# Patient Record
Sex: Female | Born: 1975 | Race: White | Hispanic: No | Marital: Single | Smoking: Never smoker
Health system: Southern US, Community
[De-identification: ages and names within clinical notes are randomized; demographics above are authoritative.]

## PROBLEM LIST (undated history)

## (undated) DIAGNOSIS — E042 Nontoxic multinodular goiter: Secondary | ICD-10-CM

## (undated) HISTORY — DX: Nontoxic multinodular goiter: E04.2

---

## 2000-03-08 ENCOUNTER — Emergency Department (HOSPITAL_COMMUNITY): Admission: EM | Admit: 2000-03-08 | Discharge: 2000-03-08 | Payer: Self-pay | Admitting: Internal Medicine

## 2002-08-10 ENCOUNTER — Other Ambulatory Visit: Admission: RE | Admit: 2002-08-10 | Discharge: 2002-08-10 | Payer: Self-pay | Admitting: Obstetrics and Gynecology

## 2003-08-31 ENCOUNTER — Other Ambulatory Visit: Admission: RE | Admit: 2003-08-31 | Discharge: 2003-08-31 | Payer: Self-pay | Admitting: Obstetrics and Gynecology

## 2004-08-31 ENCOUNTER — Other Ambulatory Visit: Admission: RE | Admit: 2004-08-31 | Discharge: 2004-08-31 | Payer: Self-pay | Admitting: Obstetrics and Gynecology

## 2005-10-22 ENCOUNTER — Other Ambulatory Visit: Admission: RE | Admit: 2005-10-22 | Discharge: 2005-10-22 | Payer: Self-pay | Admitting: Obstetrics and Gynecology

## 2008-01-01 ENCOUNTER — Emergency Department (HOSPITAL_COMMUNITY): Admission: EM | Admit: 2008-01-01 | Discharge: 2008-01-01 | Payer: Self-pay | Admitting: Family Medicine

## 2012-01-07 ENCOUNTER — Other Ambulatory Visit: Payer: Self-pay | Admitting: Obstetrics & Gynecology

## 2012-01-07 DIAGNOSIS — E041 Nontoxic single thyroid nodule: Secondary | ICD-10-CM

## 2012-01-08 ENCOUNTER — Other Ambulatory Visit: Payer: Self-pay | Admitting: Obstetrics & Gynecology

## 2012-01-08 ENCOUNTER — Ambulatory Visit
Admission: RE | Admit: 2012-01-08 | Discharge: 2012-01-08 | Disposition: A | Discharge status: Home / Self Care | Payer: BC Managed Care – PPO | Source: Ambulatory Visit | Attending: Obstetrics & Gynecology | Admitting: Obstetrics & Gynecology

## 2012-01-08 DIAGNOSIS — E041 Nontoxic single thyroid nodule: Secondary | ICD-10-CM

## 2012-01-10 ENCOUNTER — Other Ambulatory Visit (HOSPITAL_COMMUNITY)
Admission: RE | Admit: 2012-01-10 | Discharge: 2012-01-10 | Disposition: A | Discharge status: Home / Self Care | Payer: BC Managed Care – PPO | Source: Ambulatory Visit | Attending: Interventional Radiology | Admitting: Interventional Radiology

## 2012-01-10 ENCOUNTER — Ambulatory Visit
Admission: RE | Admit: 2012-01-10 | Discharge: 2012-01-10 | Disposition: A | Discharge status: Home / Self Care | Payer: BC Managed Care – PPO | Source: Ambulatory Visit | Attending: Obstetrics & Gynecology | Admitting: Obstetrics & Gynecology

## 2012-01-10 DIAGNOSIS — E049 Nontoxic goiter, unspecified: Secondary | ICD-10-CM | POA: Insufficient documentation

## 2012-01-10 DIAGNOSIS — E041 Nontoxic single thyroid nodule: Secondary | ICD-10-CM

## 2013-09-26 IMAGING — US US SOFT TISSUE HEAD/NECK
1 series · 14 of 25 positions shown · non-contrast
Comparison: None.

CLINICAL DATA: Thyromegaly.

THYROID ULTRASOUND
TECHNIQUE: Ultrasound examination of the thyroid gland and adjacent
soft tissues was performed.

[Series 1: us soft tissue head/neck · 0.08mm/px · 14 of 66 slices shown]
[im 1/66]
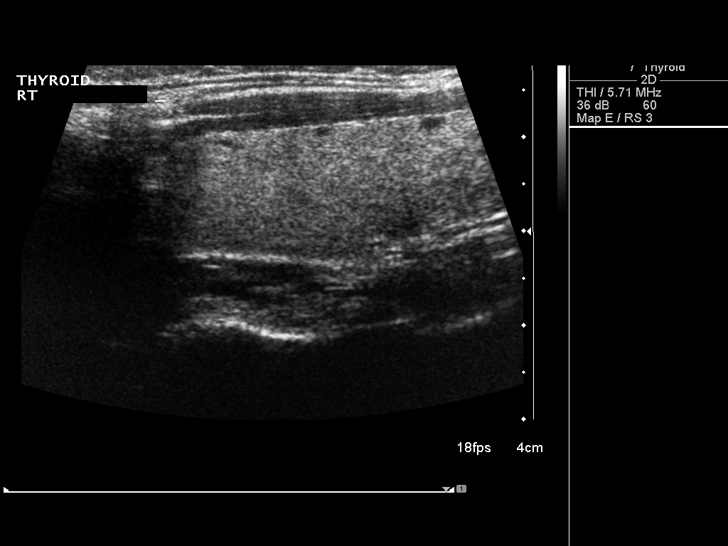
[im 6/66]
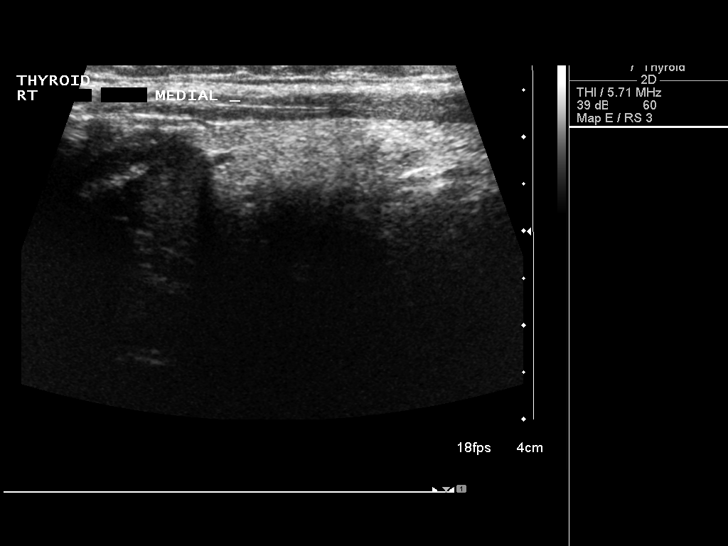
[im 11/66]
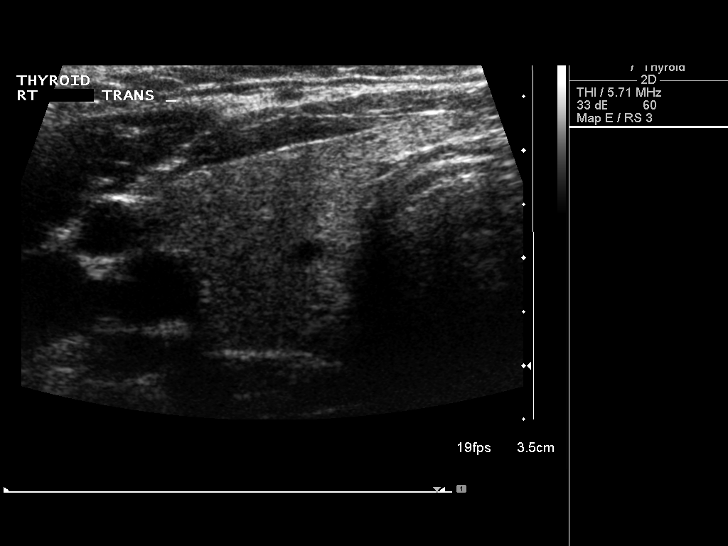
[im 17/66]
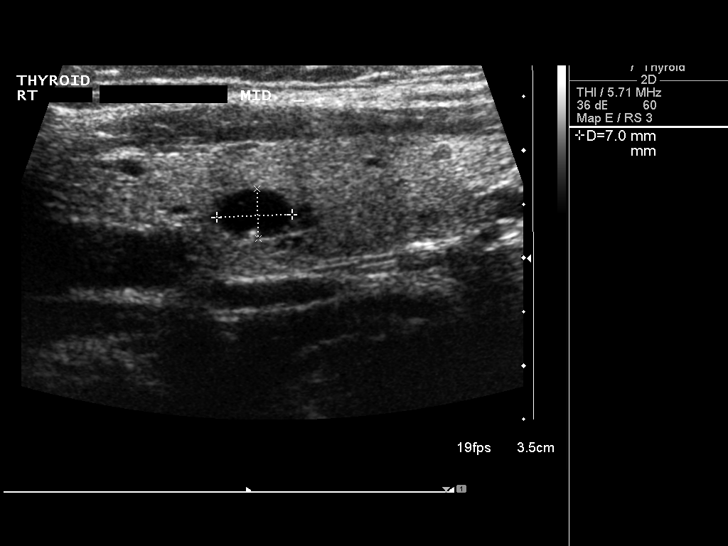
[im 22/66]
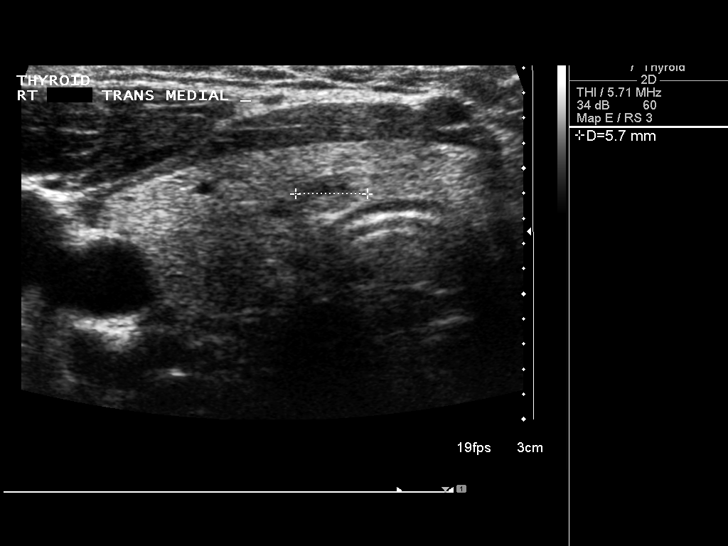
[im 25/66]
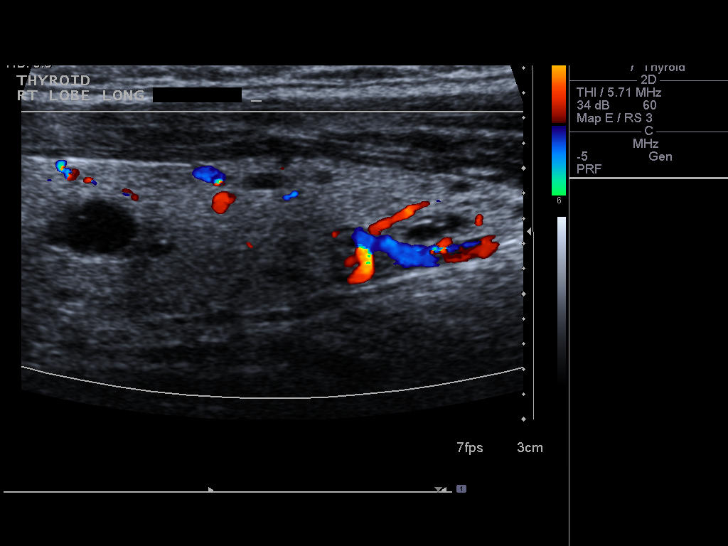
[im 30/66]
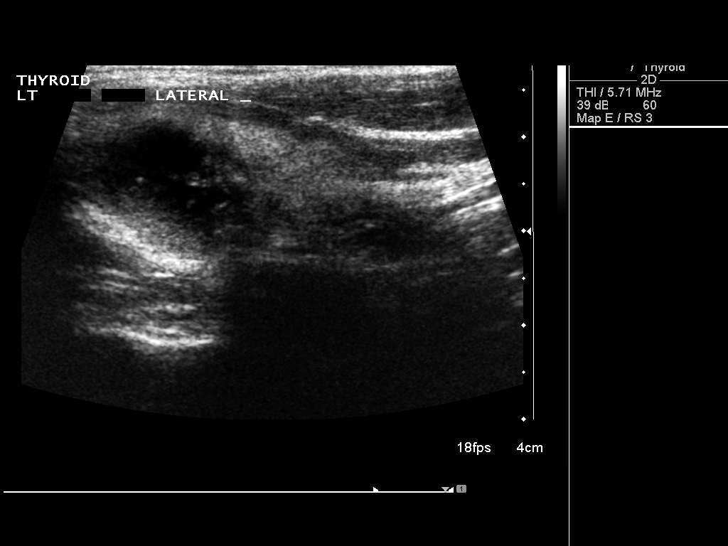
[im 36/66]
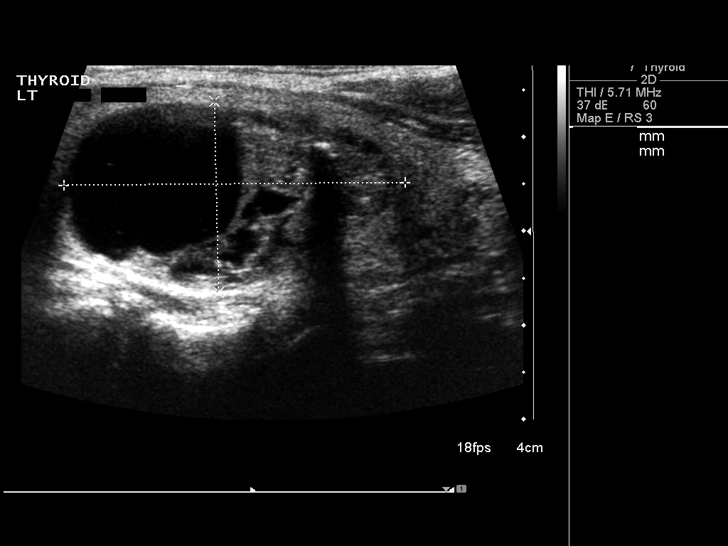
[im 41/66]
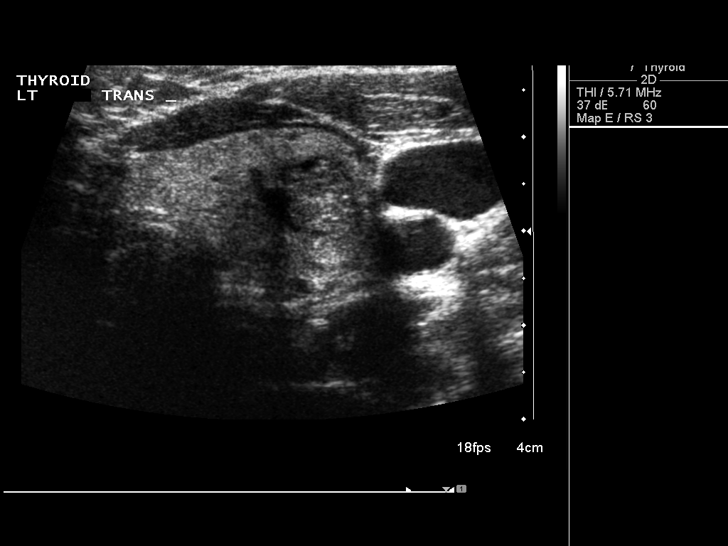
[im 44/66]
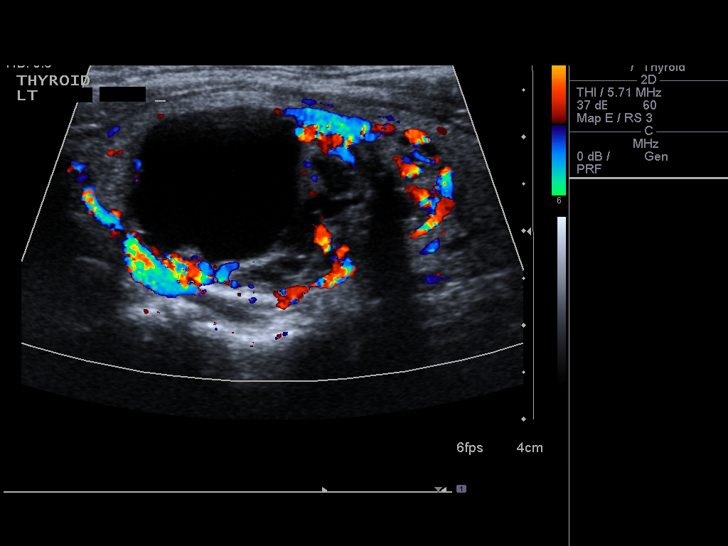
[im 49/66]
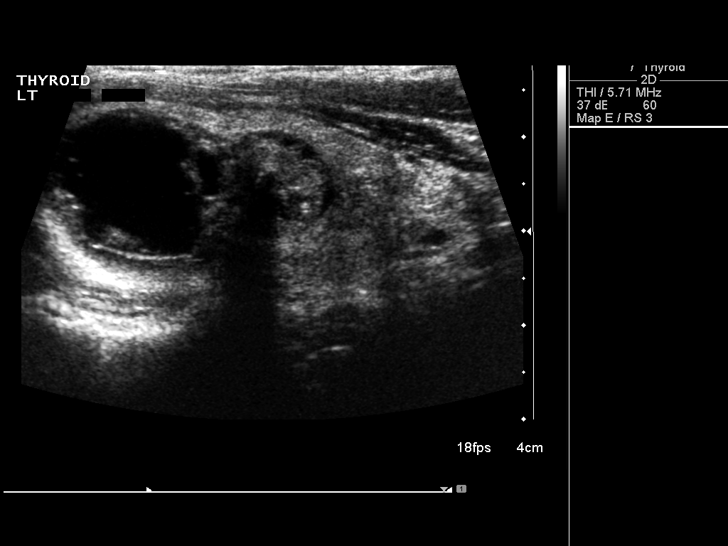
[im 55/66]
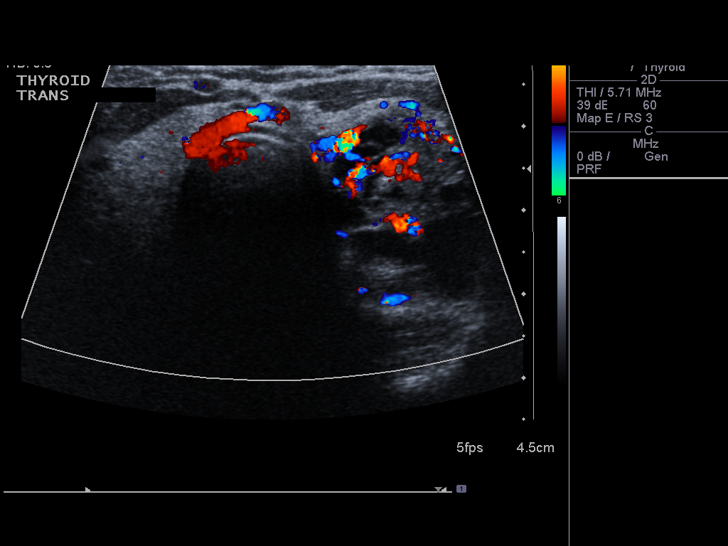
[im 60/66]
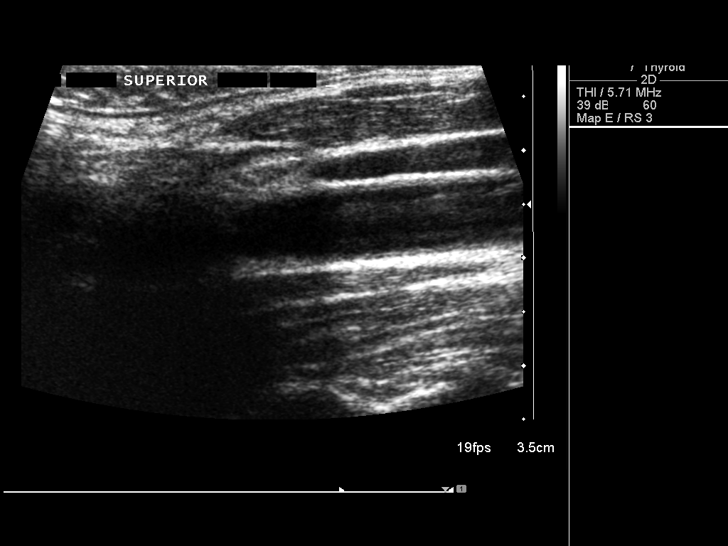
[im 66/66]
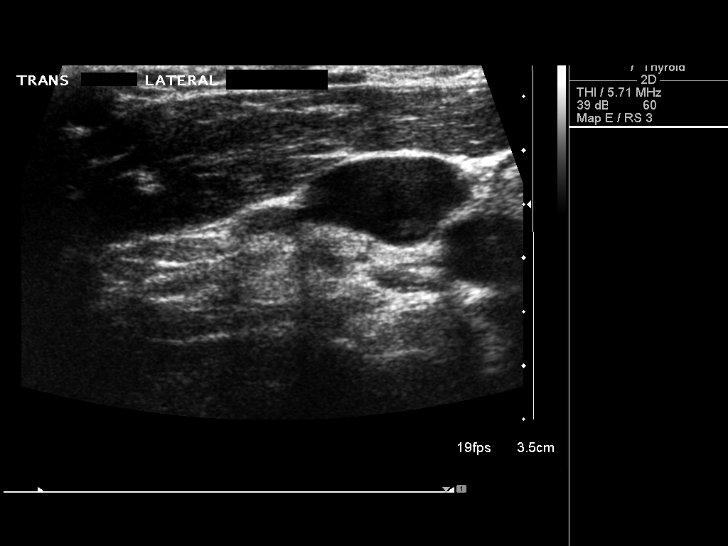

[14 of 25 positions shown; findings below may reference images not displayed]

FINDINGS: Right thyroid lobe:  Measures 5.9 x 1.6 x 2.1 cm.
Left thyroid lobe:  Measures 6.2 x 2.2 x 2.7 cm.
Isthmus:  Measures 0.4 cm.

Homogeneous thyroid echotexture.

Focal nodules:  There are numerous tiny nodules in the right
thyroid lobe.  The largest cystic lesion measures 7 mm and the
largest solid lesion measures 6.6 mm.

In the left lobe there is a large complex thyroid mass with cystic
and solid components and calcification.  It measures a maximum of
3.6 x 2.0 x 2.7 cm.  Recommend fine needle aspiration/biopsy.

Lymphadenopathy:  None visualized.
IMPRESSION: 1.  Large complex dominant left thyroid nodule requires fine-needle
aspiration/biopsy.
2.  Multiple small nodules in the right thyroid lobe.

## 2013-09-28 IMAGING — US US THYROID BIOPSY
1 series · 11 of 11 positions shown · non-contrast
Comparison: Thyroid ultrasound dated 01/08/2012

CLINICAL DATA: Dominant left thyroid nodule.

ULTRASOUND GUIDED NEEDLE ASPIRATE BIOPSY OF THE THYROID GLAND

[Series 1: us thyroid biopsy · 0.08mm/px · 11 acquisitions, 11 frames shown]
[im 1/11]
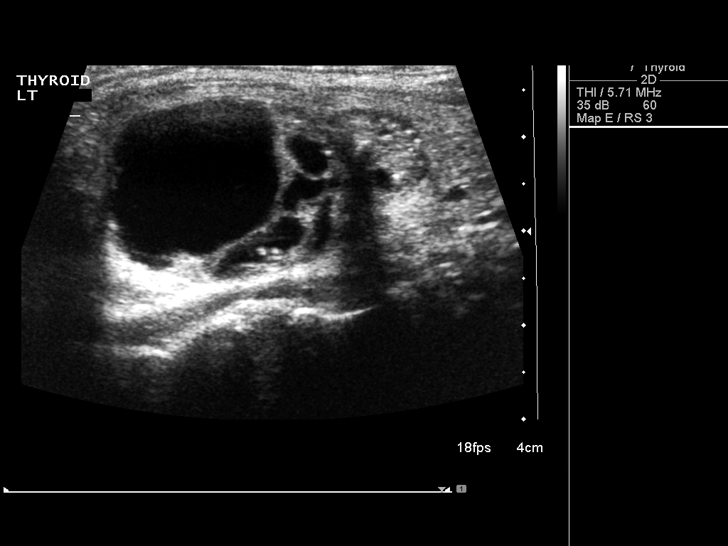
[im 2/11]
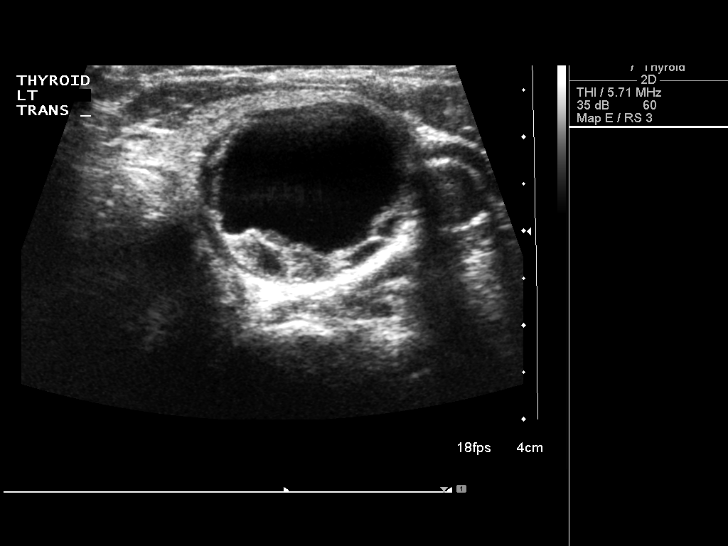
[im 3/11]
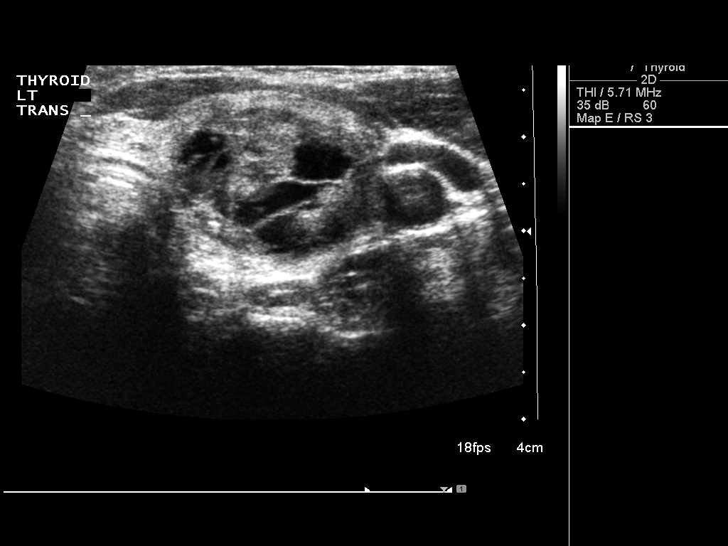
[im 4/11]
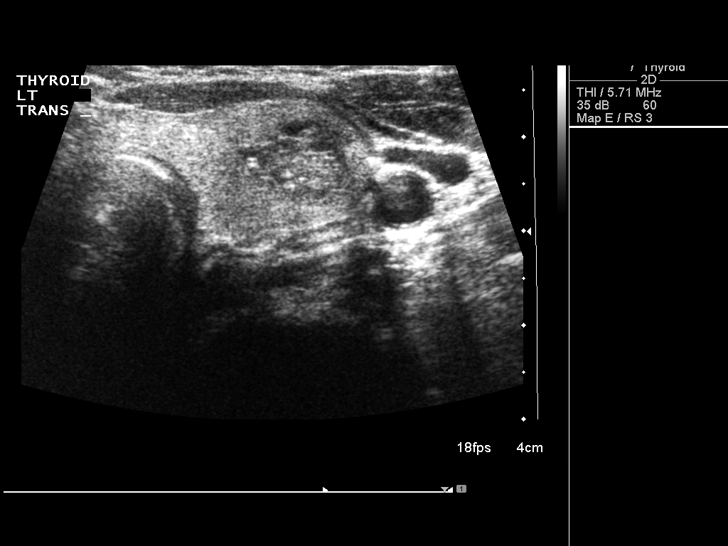
[im 5/11]
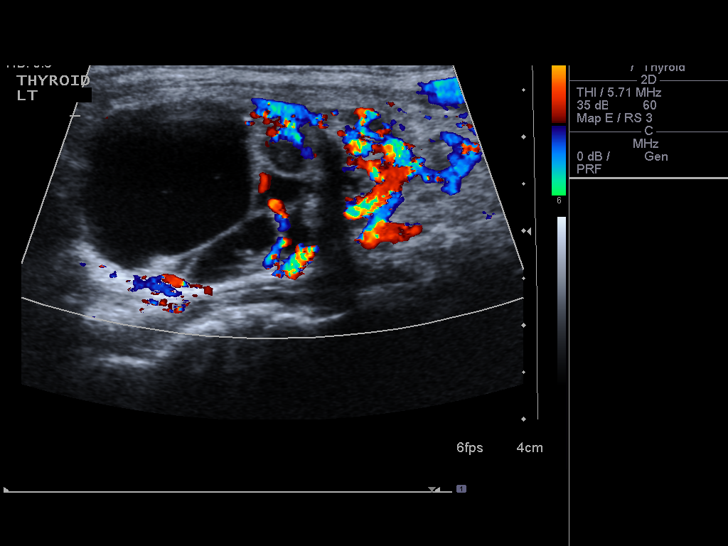
[im 6/11]
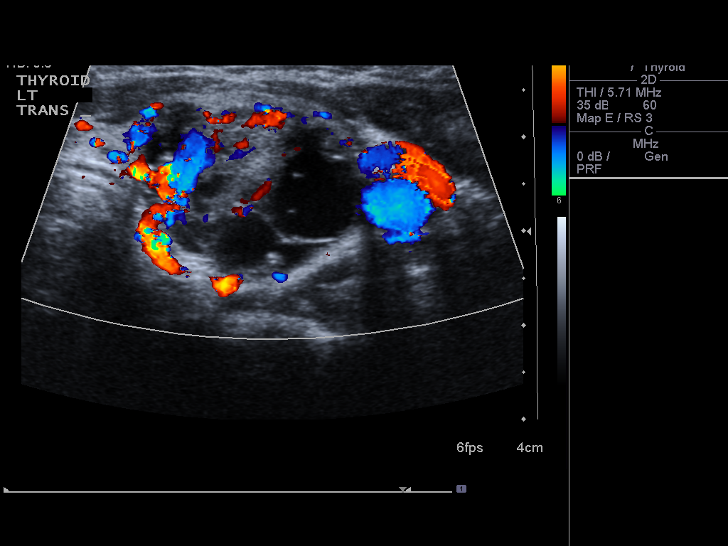
[im 7/11]
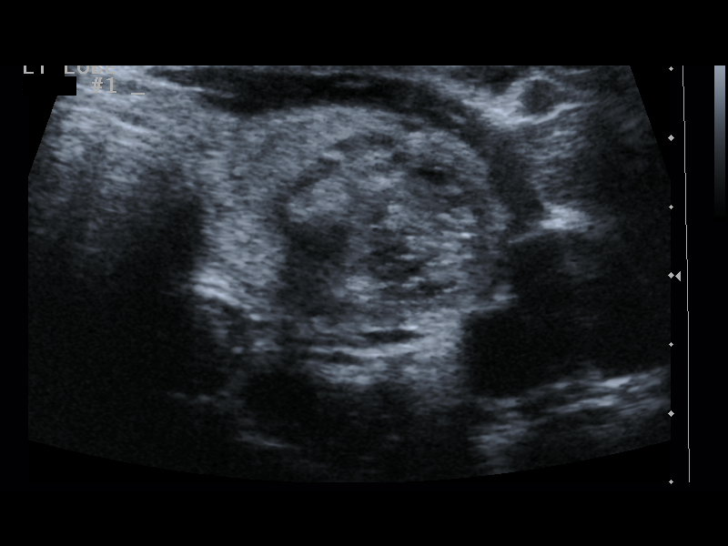
[im 8/11]
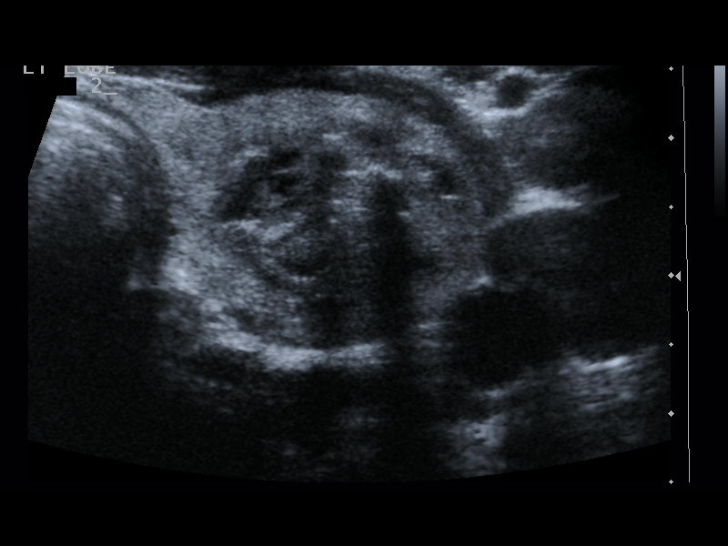
[im 9/11]
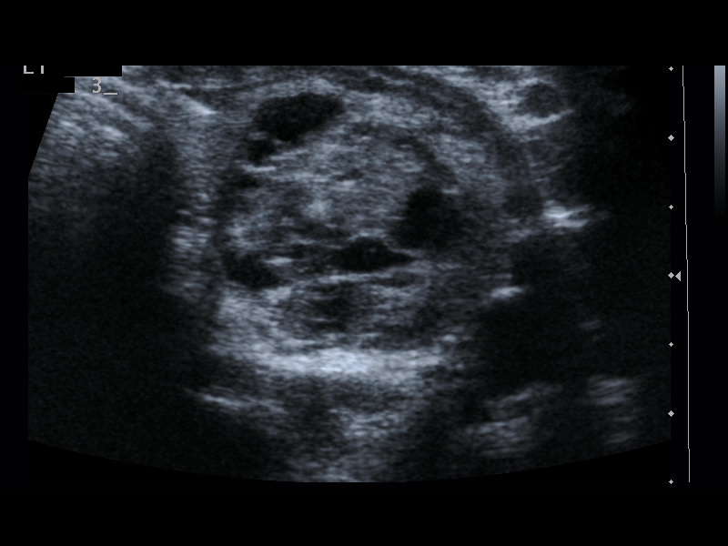
[im 10/11]
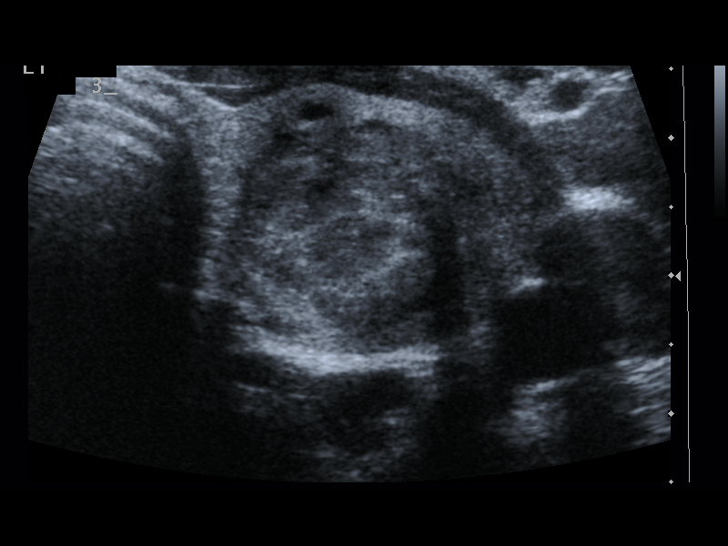
[im 11/11]
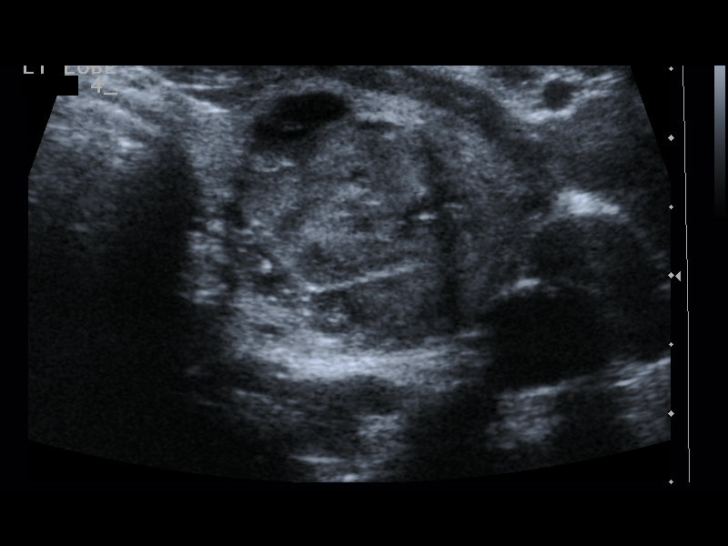

[11 of 11 positions shown; findings below may reference images not displayed]

Thyroid biopsy was thoroughly discussed with the patient and
questions were answered.  The benefits, risks, alternatives, and
complications were also discussed.  The patient understands and
wishes to proceed with the procedure.  Written consent was
obtained.

Ultrasound was performed to localize and mark an adequate site for
the biopsy.  The patient was then prepped and draped in a normal
sterile fashion.  Local anesthesia was provided with 1% lidocaine.
Using direct ultrasound guidance, 4 passes were made using 25 gauge
needles into the nodule within the left lobe of the thyroid.
Ultrasound was used to confirm needle placements on all occasions.
Specimens were sent to Pathology for analysis.

Complications:  None
FINDINGS: Needle aspirate samples were obtained in both solid and
cystic aspects of the dominant left thyroid nodule.
IMPRESSION: Ultrasound guided needle aspirate biopsy performed of the left
thyroid nodule.

## 2014-02-15 ENCOUNTER — Encounter: Payer: Self-pay | Admitting: Podiatry

## 2014-02-15 ENCOUNTER — Ambulatory Visit (INDEPENDENT_AMBULATORY_CARE_PROVIDER_SITE_OTHER): Payer: BC Managed Care – PPO | Admitting: Podiatry

## 2014-02-15 ENCOUNTER — Ambulatory Visit: Payer: Self-pay

## 2014-02-15 VITALS — BP 131/71 | HR 57 | Resp 16 | Ht 67.0 in | Wt 158.0 lb

## 2014-02-15 DIAGNOSIS — M2020 Hallux rigidus, unspecified foot: Secondary | ICD-10-CM

## 2014-02-15 DIAGNOSIS — M948X9 Other specified disorders of cartilage, unspecified sites: Secondary | ICD-10-CM

## 2014-02-15 MED ORDER — TRIAMCINOLONE ACETONIDE 10 MG/ML IJ SUSP
10.0000 mg | Freq: Once | INTRAMUSCULAR | Status: AC
  Administered 2014-02-15: 10 mg

## 2014-02-15 NOTE — Progress Notes (Signed)
   Subjective:    Patient ID: Ashlee HawthorneMichelle L Chumney, female    DOB: 10/25/1976, 38 y.o.   MRN: [id number]  HPI Comments: "I have pain at this joint"  Patient c/o aching 1st MPJ right for a couple months. The area usually does NOT swell or get red. She wears high heels for work and they make extremely uncomfortable. Walking aggravates. She hasn't tried any home treatments.  Foot Pain      Review of Systems  All other systems reviewed and are negative.      Objective:   Physical Exam        Assessment & Plan:

## 2014-02-15 NOTE — Progress Notes (Signed)
Subjective:     Patient ID: Ashlee HawthorneMichelle L Bowman, female   DOB: 06/05/1976, 38 y.o.   MRN: [id number]  Foot Pain   patient states that she's been having pain around her big toe joint for the last few months and has gradually gotten worse. She can walk on it and is wearing a heel shoe today but it is uncomfortable   Review of Systems  All other systems reviewed and are negative.      Objective:   Physical Exam  Nursing note and vitals reviewed. Constitutional: She is oriented to person, place, and time.  Cardiovascular: Intact distal pulses.   Musculoskeletal: Normal range of motion.  Neurological: She is oriented to person, place, and time.  Skin: Skin is warm.   neurovascular status was found to be intact with range of motion subtalar midtarsal joint and muscle strength adequate. First MPJ range of motion is adequate with no crepitus or limitation noted and I noted  discomfort plantar aspect first metatarsal head around the tibial sesamoidal complex. Patient has good Fill time and was found to have normal arch height     Assessment:     Probable sesamoiditis around the first metatarsal with possibility for small fracture of the sesamoid    Plan:     H&P and x-ray reviewed. Did a careful plantar injection 3 mg Kenalog 5 mg Xylocaine Marcaine mixture and applied dancers pads reduce pressure against the joint. Reappoint one week and also discussed with her possibility of fracture the sesamoid that we may need to immobilize and ultimately it is possible the sesamoid we'll have to be removed

## 2014-02-24 ENCOUNTER — Ambulatory Visit (INDEPENDENT_AMBULATORY_CARE_PROVIDER_SITE_OTHER): Payer: BC Managed Care – PPO | Admitting: Podiatry

## 2014-02-24 ENCOUNTER — Encounter: Payer: Self-pay | Admitting: Podiatry

## 2014-02-24 VITALS — BP 138/80 | HR 87 | Resp 16

## 2014-02-24 DIAGNOSIS — M948X9 Other specified disorders of cartilage, unspecified sites: Secondary | ICD-10-CM

## 2014-02-25 NOTE — Progress Notes (Signed)
Subjective:     Patient ID: Ashlee HawthorneMichelle L Ralph, female   DOB: 03/07/1976, 38 y.o.   MRN: [id number]  HPI patient presents stating my big toe joint feels quite a bit better with mild discomfort but no true deep pain noted. I'm still scared to bend it too much   Review of Systems     Objective:   Physical Exam Neurovascular status intact with range of motion first MPJ that's doing very well with slight crepitus and minimal discomfort when moved    Assessment:     Functional hallux limitus condition which responded well to injection treatment    Plan:     Reviewed condition and recommended long-term orthotics with graphite extension which were casted today. She will be returned when custom orthotics are ready in approximately 2 weeks

## 2014-03-19 ENCOUNTER — Other Ambulatory Visit: Payer: BC Managed Care – PPO

## 2014-03-26 ENCOUNTER — Ambulatory Visit (INDEPENDENT_AMBULATORY_CARE_PROVIDER_SITE_OTHER): Payer: BC Managed Care – PPO | Admitting: *Deleted

## 2014-03-26 ENCOUNTER — Other Ambulatory Visit: Payer: BC Managed Care – PPO

## 2014-03-26 DIAGNOSIS — M2020 Hallux rigidus, unspecified foot: Secondary | ICD-10-CM

## 2014-03-26 NOTE — Progress Notes (Signed)
   Subjective:    Patient ID: Ashlee Bowman, female    DOB: 03/18/1976, 38 y.o.   MRN: [id number]  HPI PICK UP ORTHOTICS AND GIVEN INSTRUCTION.   Review of Systems     Objective:   Physical Exam        Assessment & Plan:

## 2014-03-26 NOTE — Patient Instructions (Signed)

## 2014-08-25 ENCOUNTER — Other Ambulatory Visit: Payer: Self-pay

## 2014-08-26 LAB — CYTOLOGY - PAP

## 2014-09-27 ENCOUNTER — Other Ambulatory Visit: Payer: Self-pay

## 2015-11-30 ENCOUNTER — Other Ambulatory Visit: Payer: Self-pay | Admitting: Obstetrics & Gynecology

## 2017-12-12 DIAGNOSIS — Z6823 Body mass index (BMI) 23.0-23.9, adult: Secondary | ICD-10-CM | POA: Diagnosis not present

## 2017-12-12 DIAGNOSIS — Z124 Encounter for screening for malignant neoplasm of cervix: Secondary | ICD-10-CM | POA: Diagnosis not present

## 2017-12-12 DIAGNOSIS — Z113 Encounter for screening for infections with a predominantly sexual mode of transmission: Secondary | ICD-10-CM | POA: Diagnosis not present

## 2017-12-12 DIAGNOSIS — Z01419 Encounter for gynecological examination (general) (routine) without abnormal findings: Secondary | ICD-10-CM | POA: Diagnosis not present

## 2019-09-17 DIAGNOSIS — J029 Acute pharyngitis, unspecified: Secondary | ICD-10-CM | POA: Diagnosis not present

## 2019-09-17 DIAGNOSIS — E041 Nontoxic single thyroid nodule: Secondary | ICD-10-CM | POA: Diagnosis not present

## 2019-09-17 DIAGNOSIS — J069 Acute upper respiratory infection, unspecified: Secondary | ICD-10-CM | POA: Diagnosis not present

## 2022-07-13 ENCOUNTER — Ambulatory Visit: Payer: No Typology Code available for payment source | Admitting: Sports Medicine

## 2022-07-13 ENCOUNTER — Ambulatory Visit (INDEPENDENT_AMBULATORY_CARE_PROVIDER_SITE_OTHER): Payer: No Typology Code available for payment source

## 2022-07-13 VITALS — BP 110/68 | HR 78 | Ht 67.0 in | Wt 176.0 lb

## 2022-07-13 DIAGNOSIS — M5136 Other intervertebral disc degeneration, lumbar region: Secondary | ICD-10-CM | POA: Diagnosis not present

## 2022-07-13 DIAGNOSIS — G8929 Other chronic pain: Secondary | ICD-10-CM | POA: Diagnosis not present

## 2022-07-13 DIAGNOSIS — M5441 Lumbago with sciatica, right side: Secondary | ICD-10-CM | POA: Diagnosis not present

## 2022-07-13 DIAGNOSIS — M545 Low back pain, unspecified: Secondary | ICD-10-CM | POA: Diagnosis not present

## 2022-07-13 MED ORDER — MELOXICAM 15 MG PO TABS: 15.0000 mg | ORAL_TABLET | Freq: Every day | ORAL | 0 refills | Status: DC

## 2022-07-13 NOTE — Patient Instructions (Addendum)
Good to see you  - Start meloxicam 15 mg daily x2 weeks.  If still having pain after 2 weeks, complete 3rd-week of meloxicam. May use remaining meloxicam as needed once daily for pain control.  Do not to use additional NSAIDs while taking meloxicam.  May use Tylenol [id number] mg 2 to 3 times a day for breakthrough pain. HEP  3 week follow up

## 2022-07-13 NOTE — Progress Notes (Signed)
    [facility] [address] [place] [address] Phone: [phone]   Assessment and Plan:     1. Chronic bilateral low back pain with right-sided sciatica 2. DDD (degenerative disc disease), lumbar -Chronic with exacerbation, initial sports medicine visit - Most consistent with sciatica likely of lumbar origin based on HPI, physical exam, x-ray images showing grade 1 anterior listhesis L4 and L5 with decreased disc space - I suspect that at some point patient had a herniated disc at L4-L5 which has resulted in degenerative changes seen on x-ray and is leading to current inflammation and symptoms - X-ray obtained in clinic.  My interpretation: No acute fracture or vertebral collapse.  Grade 1 anterior listhesis L4 and L5 and decreased disc space most prominent between L4-L5 - Start meloxicam 15 mg daily x2 weeks.  If still having pain after 2 weeks, complete 3rd-week of meloxicam. May use remaining meloxicam as needed once daily for pain control.  Do not to use additional NSAIDs while taking meloxicam.  May use Tylenol 680 121 1943 mg 2 to 3 times a day for breakthrough pain. -Start HEP for core and low back strengthening to offload low back  Other orders - meloxicam (MOBIC) 15 MG tablet; Take 1 tablet (15 mg total) by mouth daily.    Pertinent previous records reviewed include none   Follow Up: 3 to 4 weeks for reevaluation.  If no improvement or worsening of symptoms, could consider OMT versus MRI of lumbar spine to eventually consider epidural injections   Subjective:   I, Ashlee Bowman, am serving as a Neurosurgeon for Doctor Richardean Sale  Chief Complaint: back pain   HPI:   07/13/22 Patient is a 46 year old female complaining of back pain. Patient states that she takes pure bare classes when she does a plank she feels a popping sensation in her lower spine that radiates down her leg, she notes she has numbness on her lateral  thigh, sitting causes pain , this has been going on since feb, massaging feels good but the pain doesn't go away, no meds regularly for the pain , family hx of back surgeries , states she has sciatic pain that goes down the leg , no radiating pain , she is unable to do certain movements, no hip pain   Relevant Historical Information: None pertinent  Additional pertinent review of systems negative.   Current Outpatient Medications:    meloxicam (MOBIC) 15 MG tablet, Take 1 tablet (15 mg total) by mouth daily., Disp: 30 tablet, Rfl: 0   Objective:     Vitals:   07/13/22 1019  BP: 110/68  Pulse: 78  SpO2: 99%  Weight: 176 lb (79.8 kg)  Height: 5\' 7"  (1.702 m)      Body mass index is 27.57 kg/m.    Physical Exam:    Gen: Appears well, nad, nontoxic and pleasant Psych: Alert and oriented, appropriate mood and affect Neuro: sensation intact, strength is 5/5 in upper and lower extremities, muscle tone wnl Skin: no susupicious lesions or rashes  Back - Normal skin, Spine with normal alignment and no deformity.   Mild tenderness to L3-L5 vertebral process palpation.   Paraspinous muscles are not tender and without spasm Straight leg raise negative Centralized low back pain with lumbar extension Negative piriformis test Gait normal  Electronically signed by:  D[facility] 10:59 AM 07/13/22

## 2022-08-09 NOTE — Progress Notes (Signed)
    Ashlee Bowman D.[facility] Phone: [phone]   Assessment and Plan:     1. Chronic bilateral low back pain with right-sided sciatica 2. 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 Patient states that she takes pure bare classes when she does a plank she feels a popping sensation in her lower spine that radiates down her leg, she notes she has numbness on her lateral thigh, sitting causes pain , this has been going on since feb, massaging feels good but the pain doesn't go away, no meds regularly for the pain , family hx of back surgeries , states she has sciatic pain that goes down the leg , no radiating pain , she is unable to do certain movements, no hip pain    08/10/2022 Patient states that she has one meloxicam left , does feel better than it did but it is still there, leg pain has somewhat subsided    Relevant Historical Information: None pertinent  Additional pertinent review of systems negative.   Current Outpatient Medications:    meloxicam (MOBIC)  15 MG tablet, Take 1 tablet (15 mg total) by mouth daily., Disp: 30 tablet, Rfl: 0   Objective:     Vitals:   08/10/22 1024  BP: 126/80  Pulse: 61  SpO2: 98%  Weight: 170 lb (77.1 kg)  Height: 5\' 7"  (1.702 m)      Body mass index is 26.63 kg/m.    Physical Exam:      Gen: Appears well, nad, nontoxic and pleasant Psych: Alert and oriented, appropriate mood and affect Neuro: sensation intact, strength is 5/5 in upper and lower extremities, muscle tone wnl Skin: no susupicious lesions or rashes   Back - Normal skin, Spine with normal alignment and no deformity.   Mild tenderness to L3-L5 vertebral process palpation.   Paraspinous muscles are not tender and without spasm Straight leg raise positive on right, negative on the left Centralized low back pain with lumbar extension Negative piriformis test Gait normal     Electronically signed by:  Ashlee Bowman D.[facility] 10:39 AM 08/10/22

## 2022-08-10 ENCOUNTER — Ambulatory Visit: Payer: No Typology Code available for payment source | Admitting: Sports Medicine

## 2022-08-10 VITALS — BP 126/80 | HR 61 | Ht 67.0 in | Wt 170.0 lb

## 2022-08-10 DIAGNOSIS — M5441 Lumbago with sciatica, right side: Secondary | ICD-10-CM

## 2022-08-10 DIAGNOSIS — M5136 Other intervertebral disc degeneration, lumbar region: Secondary | ICD-10-CM

## 2022-08-10 DIAGNOSIS — G8929 Other chronic pain: Secondary | ICD-10-CM | POA: Diagnosis not present

## 2022-08-10 NOTE — Patient Instructions (Addendum)
Good to see you  MRI referral  Follow up 3 days after to discuss your results

## 2022-09-05 ENCOUNTER — Ambulatory Visit
Admission: RE | Admit: 2022-09-05 | Discharge: 2022-09-05 | Disposition: A | Discharge status: Home / Self Care | Payer: No Typology Code available for payment source | Source: Ambulatory Visit | Attending: Sports Medicine | Admitting: Sports Medicine

## 2022-09-05 DIAGNOSIS — G8929 Other chronic pain: Secondary | ICD-10-CM

## 2022-09-05 DIAGNOSIS — M5136 Other intervertebral disc degeneration, lumbar region: Secondary | ICD-10-CM

## 2022-09-17 NOTE — Progress Notes (Unsigned)
    Ashlee Bowman D.[facility] [address] [place] [address] Phone: [phone]   Assessment and Plan:     There are no diagnoses linked to this encounter.  ***   Pertinent previous records reviewed include ***   Follow Up: ***     Subjective:   I, Ashlee Bowman, am serving as a Neurosurgeon for Doctor Ashlee Bowman   Chief Complaint: back pain    HPI:    07/13/22 Patient is a 46 year old female complaining of back pain. Patient states that she takes pure bare classes when she does a plank she feels a popping sensation in her lower spine that radiates down her leg, she notes she has numbness on her lateral thigh, sitting causes pain , this has been going on since feb, massaging feels good but the pain doesn't go away, no meds regularly for the pain , family hx of back surgeries , states she has sciatic pain that goes down the leg , no radiating pain , she is unable to do certain movements, no hip pain    08/10/2022 Patient states that she has one meloxicam left , does feel better than it did but it is still there, leg pain has somewhat subsided    09/18/2022 Patient states    Relevant Historical Information: None pertinent Additional pertinent review of systems negative.   Current Outpatient Medications:    meloxicam (MOBIC) 15 MG tablet, Take 1 tablet (15 mg total) by mouth daily., Disp: 30 tablet, Rfl: 0   Objective:     There were no vitals filed for this visit.    There is no height or weight on file to calculate BMI.    Physical Exam:    ***   Electronically signed by:  Ashlee Bowman D.[facility] 4:11 PM 09/17/22

## 2022-09-18 ENCOUNTER — Ambulatory Visit: Payer: No Typology Code available for payment source | Admitting: Sports Medicine

## 2022-09-18 VITALS — HR 84 | Ht 67.0 in | Wt 170.0 lb

## 2022-09-18 DIAGNOSIS — G8929 Other chronic pain: Secondary | ICD-10-CM

## 2022-09-18 DIAGNOSIS — M5136 Other intervertebral disc degeneration, lumbar region: Secondary | ICD-10-CM

## 2022-09-18 DIAGNOSIS — M5126 Other intervertebral disc displacement, lumbar region: Secondary | ICD-10-CM

## 2022-09-18 DIAGNOSIS — M5441 Lumbago with sciatica, right side: Secondary | ICD-10-CM | POA: Diagnosis not present

## 2022-09-18 NOTE — Patient Instructions (Addendum)
Good to see you  Back HEP Discontinue meloxicam use remainder as needed  Call us and let us know if you would like an epidural injection As needed follow up

## 2024-08-06 ENCOUNTER — Ambulatory Visit: Admitting: Family Medicine

## 2024-08-06 ENCOUNTER — Encounter: Payer: Self-pay | Admitting: Family Medicine

## 2024-08-06 VITALS — BP 130/86 | HR 68 | Temp 98.0°F | Wt 189.0 lb

## 2024-08-06 DIAGNOSIS — N951 Menopausal and female climacteric states: Secondary | ICD-10-CM | POA: Diagnosis not present

## 2024-08-06 DIAGNOSIS — E041 Nontoxic single thyroid nodule: Secondary | ICD-10-CM | POA: Diagnosis not present

## 2024-08-06 DIAGNOSIS — Z79899 Other long term (current) drug therapy: Secondary | ICD-10-CM

## 2024-08-06 DIAGNOSIS — Z1211 Encounter for screening for malignant neoplasm of colon: Secondary | ICD-10-CM | POA: Diagnosis not present

## 2024-08-06 DIAGNOSIS — E7800 Pure hypercholesterolemia, unspecified: Secondary | ICD-10-CM

## 2024-08-06 DIAGNOSIS — R946 Abnormal results of thyroid function studies: Secondary | ICD-10-CM

## 2024-08-06 DIAGNOSIS — R5383 Other fatigue: Secondary | ICD-10-CM

## 2024-08-06 DIAGNOSIS — R635 Abnormal weight gain: Secondary | ICD-10-CM

## 2024-08-06 NOTE — Progress Notes (Signed)
 New Patient Visit  Subjective:     Patient ID: Ashlee Bowman, female    DOB: 1975-12-26, 48 y.o.   MRN: [id number]  No chief complaint on file.   HPI  Discussed the use of AI scribe software for clinical note transcription with the patient, who gave verbal consent to proceed.  History of Present Illness Ashlee Bowman is a 48 year old female who presents for establishment of care and evaluation of thyroid  and cholesterol levels.  Thyroid  dysfunction and thyroid  nodules - Concern regarding thyroid  function due to T3 level of zero in April 2025, with other thyroid  hormone levels within normal limits - Thyroid  nodules discovered in 2013, monitored regularly with ultrasounds and biopsy, showing no significant changes - No prior abnormal thyroid  levels until the recent T3 finding  Hyperlipidemia - Elevated total cholesterol of 255 mg/dL in April 2025 - Family history of hyperlipidemia - Diet monitored with avoidance of fried foods, but continued intake of high-cholesterol foods such as shrimp and cheese  Fatigue and weight gain - Persistent fatigue and weight gain over the past five years, attributed to decreased physical activity since working from home - Regular exercise and consistent diet maintained - Concern for possible liver dysfunction due to weight gain - B12 complex vitamin and electrolytes taken for energy - No dizziness or palpitations  Sleep disturbance and anxiety - Wakes at night due to anxiety or need to urinate, but able to fall back asleep quickly - Typically obtains 7-9 hours of sleep per night  Resolved jaw pain - Jaw pain occurred following a viral infection, now resolved     ROS Per HPI  Outpatient Encounter Medications as of 08/06/2024  Medication Sig   B Complex Vitamins (VITAMIN B-COMPLEX) TABS Take 1 tablet by mouth daily with breakfast.   FOLIC ACID PO Take by mouth.   LO LOESTRIN FE 1 MG-10 MCG / 10 MCG tablet Take 1 tablet by mouth  daily.   Multiple Vitamin (QUINTABS) TABS Take 1 tablet by mouth daily.   POTASSIUM PO Take by mouth.   [DISCONTINUED] meloxicam  (MOBIC ) 15 MG tablet Take 1 tablet (15 mg total) by mouth daily.   No facility-administered encounter medications on file as of 08/06/2024.    Past Medical History:  Diagnosis Date   Multiple thyroid  nodules     History reviewed. No pertinent surgical history.  Family History  Problem Relation Age of Onset   Heart disease Father    Diabetes Maternal Grandfather     Social History   Socioeconomic History   Marital status: Single    Spouse name: Not on file   Number of children: Not on file   Years of education: Not on file   Highest education level: Bachelor's degree (e.g., BA, AB, BS)  Occupational History   Not on file  Tobacco Use   Smoking status: Never   Smokeless tobacco: Not on file  Substance and Sexual Activity   Alcohol use: Yes    Alcohol/week: 3.0 standard drinks of alcohol    Types: 3 Glasses of wine per week   Drug use: Yes   Sexual activity: Yes    Birth control/protection: Surgical  Other Topics Concern   Not on file  Social History Narrative   Not on file   Social Drivers of Health   Financial Resource Strain: Low Risk  (08/05/2024)   Overall Financial Resource Strain (CARDIA)    Difficulty of Paying Living Expenses: Not hard at all  Food  Insecurity: No Food Insecurity (08/05/2024)   Hunger Vital Sign    Worried About Running Out of Food in the Last Year: Never true    Ran Out of Food in the Last Year: Never true  Transportation Needs: No Transportation Needs (08/05/2024)   PRAPARE - Administrator, Civil Service (Medical): No    Lack of Transportation (Non-Medical): No  Physical Activity: Sufficiently Active (08/05/2024)   Exercise Vital Sign    Days of Exercise per Week: 5 days    Minutes of Exercise per Session: 60 min  Stress: Stress Concern Present (08/05/2024)   [employer] of Occupational  Health - Occupational Stress Questionnaire    Feeling of Stress: To some extent  Social Connections: Moderately Integrated (08/05/2024)   Social Connection and Isolation Panel    Frequency of Communication with Friends and Family: More than three times a week    Frequency of Social Gatherings with Friends and Family: Once a week    Attends Religious Services: 1 to 4 times per year    Active Member of [employer] or Organizations: No    Attends Engineer, structural: Not on file    Marital Status: Living with partner  Intimate Partner Violence: Not on file       Objective:    BP 130/86   Pulse 68   Temp 98 F (36.7 C)   Wt 189 lb (85.7 kg)   SpO2 98%   BMI 29.60 kg/m    Physical Exam Vitals and nursing note reviewed.  Constitutional:      General: She is not in acute distress.    Appearance: Normal appearance.  HENT:     Head: Normocephalic and atraumatic.     Right Ear: External ear normal.     Left Ear: External ear normal.     Nose: Nose normal.     Mouth/Throat:     Mouth: Mucous membranes are moist.     Pharynx: Oropharynx is clear.  Eyes:     Extraocular Movements: Extraocular movements intact.     Pupils: Pupils are equal, round, and reactive to light.  Cardiovascular:     Rate and Rhythm: Normal rate and regular rhythm.     Pulses: Normal pulses.     Heart sounds: Normal heart sounds.  Pulmonary:     Effort: Pulmonary effort is normal. No respiratory distress.     Breath sounds: Normal breath sounds. No wheezing, rhonchi or rales.  Musculoskeletal:        General: Normal range of motion.     Cervical back: Normal range of motion.     Right lower leg: No edema.     Left lower leg: No edema.  Lymphadenopathy:     Cervical: No cervical adenopathy.  Neurological:     General: No focal deficit present.     Mental Status: She is alert and oriented to person, place, and time.  Psychiatric:        Mood and Affect: Mood normal.        Thought Content:  Thought content normal.     No results found for any visits on 08/06/24.      Assessment & Plan:   Assessment and Plan Assessment & Plan Hypercholesterolemia Elevated cholesterol at 255 mg/dL with family history. Prefers lifestyle changes over medication. - Encouraged increased physical activity and dietary changes.  Thyroid  nodule(s) with normal thyroid  function Thyroid  nodules stable since 2013. Recent T3 level at zero, inconsistent with other tests. -  Ordered repeat thyroid  function tests including TSH, testosterone, and estrogen levels.  Perimenopausal symptoms, weight gain Fatigue and irregular cycles at 48 years old. On Loestrin, affecting menstrual cycle. - Considered lifestyle modifications for symptom management.  Screening for colorectal cancer Cologuard test ordered due to peer concern. - Ordered Cologuard test.  Medication management - Will check labs today, dose adjust as needed     Orders Placed This Encounter  Procedures   Cologuard    Standing Status:   Future    Expiration Date:   08/06/2025   CBC with Differential/Platelet    Standing Status:   Future    Expiration Date:   08/06/2025    Release to patient:   Immediate [1]   Comprehensive metabolic panel with GFR    Standing Status:   Future    Expiration Date:   08/06/2025    Release to patient:   Immediate [1]   Hemoglobin A1c    Standing Status:   Future    Expiration Date:   08/06/2025   Lipid panel    Standing Status:   Future    Expiration Date:   08/06/2025   Thyroid  Profile    Standing Status:   Future    Expiration Date:   08/06/2025   TSH    Standing Status:   Future    Expiration Date:   08/06/2025   Vitamin B12    Standing Status:   Future    Expiration Date:   08/06/2025   VITAMIN D 25 Hydroxy (Vit-D Deficiency, Fractures)    Standing Status:   Future    Expiration Date:   08/06/2025   T3, free    Standing Status:   Future    Expiration Date:   08/06/2025   Testosterone     Standing Status:   Future    Expiration Date:   08/06/2025   Iodine, Serum/Plasma    Standing Status:   Future    Expiration Date:   08/06/2025     No orders of the defined types were placed in this encounter.   Return for We will decide follow-up after labs result.  Corean LITTIE Ku, FNP

## 2024-08-06 NOTE — Patient Instructions (Addendum)
 Welcome to [facility]!  Thank you for choosing us  for your Primary Care needs.   We offer in person and video appointments for your convenience. You may call our office to schedule appointments, or you may schedule appointments with me through MyChart.   The best way to get in contact with me is via MyChart message. This will get to me faster than a phone call, unless there is an emergency, then please call 911.  The lab is located downstairs in the Sports Medicine building, we also have xray available there.   We are checking labs today, will be in contact with any results that require further attention.1 S

## 2024-08-19 ENCOUNTER — Telehealth: Payer: Self-pay

## 2024-08-19 NOTE — Telephone Encounter (Signed)
 Copied from CRM #[id number]. Topic: Clinical - Medical Advice >> Aug 19, 2024 12:58 PM Rea ORN wrote: Reason for CRM: pt called to advise she has not received the cologuard kid that was ordered for her on 10/2. Please call back to confirm what address it was sent to.

## 2024-08-21 ENCOUNTER — Other Ambulatory Visit: Payer: Self-pay

## 2024-08-21 DIAGNOSIS — Z1211 Encounter for screening for malignant neoplasm of colon: Secondary | ICD-10-CM

## 2024-08-21 NOTE — Telephone Encounter (Signed)
 Patient did not come back for lab work as she was supposed to, Cologuard was ordered as future along with lab orders. Cologuard has been released, she should receive it within a week or so.   Attempted to reach patient, LVM.

## 2024-09-10 LAB — COLOGUARD: COLOGUARD: NEGATIVE

## 2024-09-11 ENCOUNTER — Ambulatory Visit: Payer: Self-pay | Admitting: Family Medicine
